# Patient Record
Sex: Female | Born: 1960 | Hispanic: No | Marital: Married | Smoking: Never smoker
Health system: Southern US, Community
[De-identification: ages and names within clinical notes are randomized; demographics above are authoritative.]

## PROBLEM LIST (undated history)

## (undated) DIAGNOSIS — G43909 Migraine, unspecified, not intractable, without status migrainosus: Secondary | ICD-10-CM

## (undated) DIAGNOSIS — K589 Irritable bowel syndrome without diarrhea: Secondary | ICD-10-CM

## (undated) DIAGNOSIS — G4700 Insomnia, unspecified: Secondary | ICD-10-CM

## (undated) DIAGNOSIS — D649 Anemia, unspecified: Secondary | ICD-10-CM

## (undated) DIAGNOSIS — E785 Hyperlipidemia, unspecified: Secondary | ICD-10-CM

## (undated) HISTORY — PX: TONSILLECTOMY: SUR1361

## (undated) HISTORY — DX: Migraine, unspecified, not intractable, without status migrainosus: G43.909

## (undated) HISTORY — DX: Anemia, unspecified: D64.9

## (undated) HISTORY — DX: Insomnia, unspecified: G47.00

## (undated) HISTORY — DX: Hyperlipidemia, unspecified: E78.5

## (undated) HISTORY — PX: OTHER SURGICAL HISTORY: SHX169

## (undated) HISTORY — DX: Irritable bowel syndrome, unspecified: K58.9

---

## 2004-08-21 ENCOUNTER — Other Ambulatory Visit: Admission: RE | Admit: 2004-08-21 | Discharge: 2004-08-21 | Payer: Self-pay | Admitting: Obstetrics and Gynecology

## 2013-05-30 ENCOUNTER — Other Ambulatory Visit: Payer: Self-pay | Admitting: Obstetrics and Gynecology

## 2013-05-30 DIAGNOSIS — R928 Other abnormal and inconclusive findings on diagnostic imaging of breast: Secondary | ICD-10-CM

## 2013-06-11 ENCOUNTER — Ambulatory Visit
Admission: RE | Admit: 2013-06-11 | Discharge: 2013-06-11 | Disposition: A | Discharge status: Home / Self Care | Payer: No Typology Code available for payment source | Source: Ambulatory Visit | Attending: Obstetrics and Gynecology | Admitting: Obstetrics and Gynecology

## 2013-06-11 DIAGNOSIS — R928 Other abnormal and inconclusive findings on diagnostic imaging of breast: Secondary | ICD-10-CM

## 2013-10-19 DIAGNOSIS — G43119 Migraine with aura, intractable, without status migrainosus: Secondary | ICD-10-CM | POA: Insufficient documentation

## 2013-10-19 DIAGNOSIS — G43719 Chronic migraine without aura, intractable, without status migrainosus: Secondary | ICD-10-CM | POA: Insufficient documentation

## 2014-05-06 ENCOUNTER — Other Ambulatory Visit: Payer: Self-pay

## 2014-05-06 DIAGNOSIS — Z1231 Encounter for screening mammogram for malignant neoplasm of breast: Secondary | ICD-10-CM

## 2014-05-27 ENCOUNTER — Ambulatory Visit: Payer: No Typology Code available for payment source

## 2014-05-30 ENCOUNTER — Ambulatory Visit
Admission: RE | Admit: 2014-05-30 | Discharge: 2014-05-30 | Disposition: A | Discharge status: Home / Self Care | Payer: No Typology Code available for payment source | Source: Ambulatory Visit

## 2014-05-30 ENCOUNTER — Encounter (INDEPENDENT_AMBULATORY_CARE_PROVIDER_SITE_OTHER): Payer: Self-pay

## 2014-05-30 DIAGNOSIS — Z1231 Encounter for screening mammogram for malignant neoplasm of breast: Secondary | ICD-10-CM

## 2014-05-31 ENCOUNTER — Other Ambulatory Visit: Payer: Self-pay | Admitting: Obstetrics and Gynecology

## 2014-05-31 DIAGNOSIS — R928 Other abnormal and inconclusive findings on diagnostic imaging of breast: Secondary | ICD-10-CM

## 2014-06-07 ENCOUNTER — Ambulatory Visit
Admission: RE | Admit: 2014-06-07 | Discharge: 2014-06-07 | Disposition: A | Discharge status: Home / Self Care | Payer: No Typology Code available for payment source | Source: Ambulatory Visit | Attending: Obstetrics and Gynecology | Admitting: Obstetrics and Gynecology

## 2014-06-07 DIAGNOSIS — R928 Other abnormal and inconclusive findings on diagnostic imaging of breast: Secondary | ICD-10-CM

## 2014-06-20 DIAGNOSIS — R519 Headache, unspecified: Secondary | ICD-10-CM | POA: Insufficient documentation

## 2014-06-20 DIAGNOSIS — R5383 Other fatigue: Secondary | ICD-10-CM | POA: Insufficient documentation

## 2015-05-15 ENCOUNTER — Other Ambulatory Visit: Payer: Self-pay

## 2015-05-15 DIAGNOSIS — Z1231 Encounter for screening mammogram for malignant neoplasm of breast: Secondary | ICD-10-CM

## 2015-06-13 ENCOUNTER — Ambulatory Visit: Payer: No Typology Code available for payment source

## 2015-06-27 ENCOUNTER — Ambulatory Visit
Admission: RE | Admit: 2015-06-27 | Discharge: 2015-06-27 | Disposition: A | Discharge status: Home / Self Care | Payer: BLUE CROSS/BLUE SHIELD | Source: Ambulatory Visit

## 2015-06-27 DIAGNOSIS — Z1231 Encounter for screening mammogram for malignant neoplasm of breast: Secondary | ICD-10-CM

## 2016-05-24 ENCOUNTER — Other Ambulatory Visit: Payer: Self-pay | Admitting: Obstetrics and Gynecology

## 2016-05-24 DIAGNOSIS — Z1231 Encounter for screening mammogram for malignant neoplasm of breast: Secondary | ICD-10-CM

## 2016-06-29 ENCOUNTER — Encounter: Payer: Self-pay | Admitting: Radiology

## 2016-06-29 ENCOUNTER — Ambulatory Visit
Admission: RE | Admit: 2016-06-29 | Discharge: 2016-06-29 | Disposition: A | Discharge status: Home / Self Care | Payer: BLUE CROSS/BLUE SHIELD | Source: Ambulatory Visit | Attending: Obstetrics and Gynecology | Admitting: Obstetrics and Gynecology

## 2016-06-29 DIAGNOSIS — Z1231 Encounter for screening mammogram for malignant neoplasm of breast: Secondary | ICD-10-CM

## 2017-06-02 ENCOUNTER — Other Ambulatory Visit: Payer: Self-pay | Admitting: Obstetrics and Gynecology

## 2017-06-02 DIAGNOSIS — Z1231 Encounter for screening mammogram for malignant neoplasm of breast: Secondary | ICD-10-CM

## 2017-07-14 ENCOUNTER — Ambulatory Visit
Admission: RE | Admit: 2017-07-14 | Discharge: 2017-07-14 | Disposition: A | Discharge status: Home / Self Care | Payer: BLUE CROSS/BLUE SHIELD | Source: Ambulatory Visit | Attending: Obstetrics and Gynecology | Admitting: Obstetrics and Gynecology

## 2017-07-14 DIAGNOSIS — Z1231 Encounter for screening mammogram for malignant neoplasm of breast: Secondary | ICD-10-CM

## 2017-07-18 ENCOUNTER — Other Ambulatory Visit: Payer: Self-pay | Admitting: Obstetrics and Gynecology

## 2017-07-18 DIAGNOSIS — R928 Other abnormal and inconclusive findings on diagnostic imaging of breast: Secondary | ICD-10-CM

## 2017-07-21 ENCOUNTER — Ambulatory Visit
Admission: RE | Admit: 2017-07-21 | Discharge: 2017-07-21 | Disposition: A | Discharge status: Home / Self Care | Payer: BLUE CROSS/BLUE SHIELD | Source: Ambulatory Visit | Attending: Obstetrics and Gynecology | Admitting: Obstetrics and Gynecology

## 2017-07-21 DIAGNOSIS — R928 Other abnormal and inconclusive findings on diagnostic imaging of breast: Secondary | ICD-10-CM

## 2018-08-30 ENCOUNTER — Other Ambulatory Visit: Payer: Self-pay | Admitting: Obstetrics and Gynecology

## 2018-08-30 DIAGNOSIS — Z1231 Encounter for screening mammogram for malignant neoplasm of breast: Secondary | ICD-10-CM

## 2018-09-21 DIAGNOSIS — E785 Hyperlipidemia, unspecified: Secondary | ICD-10-CM | POA: Insufficient documentation

## 2018-09-21 DIAGNOSIS — K589 Irritable bowel syndrome without diarrhea: Secondary | ICD-10-CM | POA: Insufficient documentation

## 2018-09-21 DIAGNOSIS — G43909 Migraine, unspecified, not intractable, without status migrainosus: Secondary | ICD-10-CM | POA: Insufficient documentation

## 2018-10-19 ENCOUNTER — Ambulatory Visit
Admission: RE | Admit: 2018-10-19 | Discharge: 2018-10-19 | Disposition: A | Discharge status: Home / Self Care | Payer: BC Managed Care – PPO | Source: Ambulatory Visit | Attending: Obstetrics and Gynecology | Admitting: Obstetrics and Gynecology

## 2018-10-19 ENCOUNTER — Other Ambulatory Visit: Payer: Self-pay

## 2018-10-19 DIAGNOSIS — Z1231 Encounter for screening mammogram for malignant neoplasm of breast: Secondary | ICD-10-CM

## 2019-04-30 ENCOUNTER — Other Ambulatory Visit: Payer: Self-pay

## 2019-04-30 ENCOUNTER — Telehealth (HOSPITAL_COMMUNITY): Payer: Self-pay

## 2019-04-30 DIAGNOSIS — M79604 Pain in right leg: Secondary | ICD-10-CM

## 2019-04-30 NOTE — Telephone Encounter (Signed)

## 2019-05-01 ENCOUNTER — Encounter: Payer: Self-pay | Admitting: Vascular Surgery

## 2019-05-01 ENCOUNTER — Other Ambulatory Visit: Payer: Self-pay

## 2019-05-01 ENCOUNTER — Ambulatory Visit (INDEPENDENT_AMBULATORY_CARE_PROVIDER_SITE_OTHER): Payer: BC Managed Care – PPO | Admitting: Vascular Surgery

## 2019-05-01 ENCOUNTER — Ambulatory Visit (HOSPITAL_COMMUNITY)
Admission: RE | Admit: 2019-05-01 | Discharge: 2019-05-01 | Disposition: A | Discharge status: Home / Self Care | Payer: BC Managed Care – PPO | Source: Ambulatory Visit | Attending: Surgery | Admitting: Surgery

## 2019-05-01 VITALS — BP 103/62 | HR 58 | Temp 97.8°F | Resp 20 | Ht 63.0 in | Wt 140.0 lb

## 2019-05-01 DIAGNOSIS — M79604 Pain in right leg: Secondary | ICD-10-CM | POA: Diagnosis not present

## 2019-05-01 DIAGNOSIS — I781 Nevus, non-neoplastic: Secondary | ICD-10-CM

## 2019-05-01 DIAGNOSIS — M79605 Pain in left leg: Secondary | ICD-10-CM | POA: Diagnosis not present

## 2019-05-01 NOTE — Progress Notes (Signed)
Vascular and Vein Specialist of [facility]  Patient name: Isabel Washington MRN: [id number] DOB: 1960-04-22 Sex: female  REASON FOR CONSULT: Evaluation lower extremity telangiectasias  HPI: Isabel Washington is a 59 y.o. female, who is here today for evaluation.  She had been seen by an outlying vein center and had been recommended to undergo staged bilateral great saphenous vein ablation.  Her main concern was regarding asymptomatic telangiectasia over her thighs and most recently more noticeable at the level of her left ankle.  She does have some diffuse achiness but no evidence of swelling and no other difficulty.  She had had an initial noninvasive study at the outlying center suggesting no significant reflux.  She subsequently wore compression for 3 months and had a repeat study that did show reflux and reportedly enlarged great saphenous vein bilaterally.  She was out of network and is seeing [place] today for additional discussion.  She has no history of DVT and no history of swelling  Past Medical History:  Diagnosis Date  . Anemia   . Hyperlipidemia   . IBS (irritable bowel syndrome)   . Insomnia   . Migraines     History reviewed. No pertinent family history.  SOCIAL HISTORY: Social History   Socioeconomic History  . Marital status: Married    Spouse name: Not on file  . Number of children: Not on file  . Years of education: Not on file  . Highest education level: Not on file  Occupational History  . Not on file  Tobacco Use  . Smoking status: Never Smoker  . Smokeless tobacco: Never Used  Substance and Sexual Activity  . Alcohol use: Not Currently  . Drug use: Never  . Sexual activity: Not on file  Other Topics Concern  . Not on file  Social History Narrative  . Not on file   Social Determinants of Health   Financial Resource Strain:   . Difficulty of Paying Living Expenses: Not on file  Food Insecurity:   . Worried About Community education officer in the Last Year: Not on file  . Ran Out of Food in the Last Year: Not on file  Transportation Needs:   . Lack of Transportation (Medical): Not on file  . Lack of Transportation (Non-Medical): Not on file  Physical Activity:   . Days of Exercise per Week: Not on file  . Minutes of Exercise per Session: Not on file  Stress:   . Feeling of Stress : Not on file  Social Connections:   . Frequency of Communication with Friends and Family: Not on file  . Frequency of Social Gatherings with Friends and Family: Not on file  . Attends Religious Services: Not on file  . Active Member of Clubs or Organizations: Not on file  . Attends Banker Meetings: Not on file  . Marital Status: Not on file  Intimate Partner Violence:   . Fear of Current or Ex-Partner: Not on file  . Emotionally Abused: Not on file  . Physically Abused: Not on file  . Sexually Abused: Not on file    Allergies  Allergen Reactions  . Gabapentin Swelling    Other reaction(s): EDEMA   . Pregabalin Swelling    Feet and ankle swelling Rash on face   . Latex     Skin irritation     Current Outpatient Medications  Medication Sig Dispense Refill  . cyproheptadine (PERIACTIN) 4 MG tablet Take 4 mg by mouth daily as  needed.    Marland Kitchen HYDROcodone-acetaminophen (NORCO/VICODIN) 5-325 MG tablet hydrocodone 5 mg-acetaminophen 325 mg tablet  TAKE 1 TABLET BY MOUTH TWICE A DAY AS NEEDED    . lamoTRIgine (LAMICTAL) 100 MG tablet lamotrigine 100 mg tablet    . loperamide (IMODIUM A-D) 2 MG tablet Take by mouth.    Marland Kitchen LORazepam (ATIVAN) 1 MG tablet lorazepam 1 mg tablet  TAKE 2 AND 1/2 TABLET(S) EACH EVENING    . methocarbamol (ROBAXIN) 750 MG tablet methocarbamol 750 mg tablet    . Multiple Vitamin (MULTIVITAMIN) capsule Take 1 capsule by mouth daily.    . pravastatin (PRAVACHOL) 20 MG tablet pravastatin 20 mg tablet    . propranolol ER (INDERAL LA) 120 MG 24 hr capsule Take 120 mg by mouth daily.    .  rifaximin (XIFAXAN) 200 MG tablet Xifaxan 200 mg tablet    . rizatriptan (MAXALT) 10 MG tablet TAKE 1 TABLET BY MOUTH AT ONSET OF HEADACHE. MAY REPEAT IN 2 HOURS. NO MORE THAN 2 TABLETS IN 24 HRS    . terconazole (TERAZOL 3) 0.8 % vaginal cream terconazole 0.8 % vaginal cream  INSERT 1 APPLICATOR(S)FUL EVERY DAY BY VAGINAL ROUTE AS NEEDED.    Marland Kitchen traMADol (ULTRAM) 50 MG tablet Take 50 mg by mouth 3 (three) times daily as needed.    . verapamil (CALAN-SR) 120 MG CR tablet verapamil ER (SR) 120 mg tablet,extended release  TAKE 1 TABLET BY MOUTH DAILY. DISCONTINUE NORVASC     No current facility-administered medications for this visit.    REVIEW OF SYSTEMS:  [X]  denotes positive finding, [ ]  denotes negative finding Cardiac  Comments:  Chest pain or chest pressure:    Shortness of breath upon exertion:    Short of breath when lying flat:    Irregular heart rhythm:        Vascular    Pain in calf, thigh, or hip brought on by ambulation:    Pain in feet at night that wakes you up from your sleep:     Blood clot in your veins:    Leg swelling:         Pulmonary    Oxygen at home:    Productive cough:     Wheezing:         Neurologic    Sudden weakness in arms or legs:     Sudden numbness in arms or legs:     Sudden onset of difficulty speaking or slurred speech:    Temporary loss of vision in one eye:     Problems with dizziness:         Gastrointestinal    Blood in stool:     Vomited blood:         Genitourinary    Burning when urinating:     Blood in urine:        Psychiatric    Major depression:         Hematologic    Bleeding problems:    Problems with blood clotting too easily:        Skin    Rashes or ulcers:        Constitutional    Fever or chills:      PHYSICAL EXAM: Vitals:   05/01/19 1553  BP: 103/62  Pulse: (!) 58  Resp: 20  Temp: 97.8 F (36.6 C)  SpO2: 98%  Weight: 140 lb (63.5 kg)  Height: 5\' 3"  (1.6 m)    GENERAL: The patient is a  well-nourished female, in no acute distress. The vital signs are documented above. CARDIOVASCULAR: 2+ radial and 2+ dorsalis pedis pulses bilaterally.  She does have scattered telangiectasia over her lateral thighs.  Does have more so on her ankles most particularly her left ankle.  No swelling. PULMONARY: There is good air exchange  ABDOMEN: Soft and non-tender  MUSCULOSKELETAL: There are no major deformities or cyanosis. NEUROLOGIC: No focal weakness or paresthesias are detected. SKIN: There are no ulcers or rashes noted. PSYCHIATRIC: The patient has a normal affect.  DATA:  I reviewed her duplex report from the outlying center.  I imaged her saphenous vein with Sono[employer] in our office.  Her saphenous veins actually are quite small today bilaterally.  MEDICAL ISSUES: I had a long discussion with the patient.  I do not see any indication for ablation of her great saphenous vein.  These are actually smaller than normal with the branching throughout the thigh.  I feel that it would be technically difficult to even access her veins for ablation.  She is concerned regarding the appearance of her telangiectasia most particularly at the level of her ankle left ankle.  I did explain the option of sclerotherapy for relief of this.  Explained this would be an out-of-pocket expense.  She wishes to discuss this further with her nursing staff who would be doing the sclerotherapy.  I will coordinate this discussion with the patient.  She will see me again on an as-needed basis   Rosetta Posner, MD [facility] Tel [phone] Pager [phone]

## 2019-05-11 ENCOUNTER — Telehealth (HOSPITAL_COMMUNITY): Payer: Self-pay

## 2019-05-11 NOTE — Telephone Encounter (Signed)

## 2019-05-14 ENCOUNTER — Ambulatory Visit (INDEPENDENT_AMBULATORY_CARE_PROVIDER_SITE_OTHER): Payer: Self-pay

## 2019-05-14 ENCOUNTER — Other Ambulatory Visit: Payer: Self-pay

## 2019-05-14 DIAGNOSIS — I8393 Asymptomatic varicose veins of bilateral lower extremities: Secondary | ICD-10-CM

## 2019-05-14 NOTE — Progress Notes (Signed)
Treated front of both leg areas of concern with 1% Asclero administered with a 27g butterfly.  Patient received a total of 2 mL. Easy access. Pt tolerated well. Anticipate good results. Will follow PRN.  Photos: Yes.    Compression stockings applied: Yes.

## 2019-05-15 DIAGNOSIS — I8393 Asymptomatic varicose veins of bilateral lower extremities: Secondary | ICD-10-CM

## 2019-05-21 ENCOUNTER — Telehealth: Payer: Self-pay

## 2019-05-21 NOTE — Telephone Encounter (Signed)
Pt called one week s/p sclerotherapy treatment. She has to go on a 7 hour car/road trip. Encouraged her to get out of car every couple of hours and walk around a bit, pump her feet periodically while in the car and wear her compression stockings. No further questions/concerns at this time.

## 2019-06-13 ENCOUNTER — Telehealth: Payer: Self-pay

## 2019-06-13 NOTE — Telephone Encounter (Signed)
Called pt to f/u at one month from sclerotherapy tx. Pt feels she notices some fading but not as much as she would like. She feels she is seeing more fading recently than she had at first. We discussed healing and fading takes time and to allow a few more weeks before considering another treatment. Pt had no questions/concerns.

## 2019-10-03 ENCOUNTER — Other Ambulatory Visit: Payer: Self-pay | Admitting: Obstetrics and Gynecology

## 2019-10-03 DIAGNOSIS — Z1231 Encounter for screening mammogram for malignant neoplasm of breast: Secondary | ICD-10-CM

## 2019-10-25 ENCOUNTER — Ambulatory Visit: Payer: BC Managed Care – PPO

## 2019-11-19 ENCOUNTER — Ambulatory Visit
Admission: RE | Admit: 2019-11-19 | Discharge: 2019-11-19 | Disposition: A | Discharge status: Home / Self Care | Payer: BC Managed Care – PPO | Source: Ambulatory Visit | Attending: Obstetrics and Gynecology | Admitting: Obstetrics and Gynecology

## 2019-11-19 ENCOUNTER — Other Ambulatory Visit: Payer: Self-pay

## 2019-11-19 DIAGNOSIS — Z1231 Encounter for screening mammogram for malignant neoplasm of breast: Secondary | ICD-10-CM

## 2020-07-24 DIAGNOSIS — N3281 Overactive bladder: Secondary | ICD-10-CM | POA: Insufficient documentation

## 2020-07-24 DIAGNOSIS — R300 Dysuria: Secondary | ICD-10-CM | POA: Insufficient documentation

## 2020-09-11 ENCOUNTER — Encounter: Payer: Self-pay | Admitting: Podiatry

## 2020-09-11 ENCOUNTER — Other Ambulatory Visit: Payer: Self-pay | Admitting: Podiatry

## 2020-09-11 ENCOUNTER — Other Ambulatory Visit: Payer: Self-pay

## 2020-09-11 ENCOUNTER — Ambulatory Visit (INDEPENDENT_AMBULATORY_CARE_PROVIDER_SITE_OTHER): Payer: BC Managed Care – PPO

## 2020-09-11 ENCOUNTER — Ambulatory Visit (INDEPENDENT_AMBULATORY_CARE_PROVIDER_SITE_OTHER): Payer: BC Managed Care – PPO | Admitting: Podiatry

## 2020-09-11 DIAGNOSIS — S9032XA Contusion of left foot, initial encounter: Secondary | ICD-10-CM

## 2020-09-11 DIAGNOSIS — R319 Hematuria, unspecified: Secondary | ICD-10-CM | POA: Insufficient documentation

## 2020-09-11 DIAGNOSIS — M19072 Primary osteoarthritis, left ankle and foot: Secondary | ICD-10-CM | POA: Diagnosis not present

## 2020-09-11 DIAGNOSIS — M8700 Idiopathic aseptic necrosis of unspecified bone: Secondary | ICD-10-CM

## 2020-09-11 DIAGNOSIS — M8580 Other specified disorders of bone density and structure, unspecified site: Secondary | ICD-10-CM | POA: Insufficient documentation

## 2020-09-11 DIAGNOSIS — M2041 Other hammer toe(s) (acquired), right foot: Secondary | ICD-10-CM

## 2020-09-13 NOTE — Progress Notes (Signed)
Subjective:  Patient ID: Isabel Washington, female    DOB: 1961/01/05,  MRN: [id number] HPI Chief Complaint  Patient presents with  . Foot Pain    Dorsal forefoot left - fell down steps Oct. 2020, toes bent downward and dragged the steps-had xrayed - fx - wore boot, got better, now feeling like a band is wrapped around the big toe, discomfort if walking a lot  . New Patient (Initial Visit)    60 y.o. female presents with the above complaint.   ROS: Denies fever chills nausea vomiting muscle aches pains calf pain back pain chest pain shortness of breath.  Past Medical History:  Diagnosis Date  . Anemia   . Hyperlipidemia   . IBS (irritable bowel syndrome)   . Insomnia   . Migraines    Past Surgical History:  Procedure Laterality Date  . CESAREAN SECTION    . kidney stone removal    . TONSILLECTOMY      Current Outpatient Medications:  .  HYDROcodone-acetaminophen (NORCO/VICODIN) 5-325 MG tablet, Take 1 tablet by mouth every 6 (six) hours as needed for moderate pain., Disp: , Rfl:  .  lamoTRIgine (LAMICTAL) 100 MG tablet, lamotrigine 100 mg tablet, Disp: , Rfl:  .  LORazepam (ATIVAN) 1 MG tablet, lorazepam 1 mg tablet  TAKE 2 AND 1/2 TABLET(S) EACH EVENING, Disp: , Rfl:  .  methocarbamol (ROBAXIN) 750 MG tablet, methocarbamol 750 mg tablet, Disp: , Rfl:  .  Multiple Vitamin (MULTIVITAMIN) capsule, Take 1 capsule by mouth daily., Disp: , Rfl:  .  NURTEC 75 MG TBDP, Take by mouth., Disp: , Rfl:  .  pravastatin (PRAVACHOL) 20 MG tablet, pravastatin 20 mg tablet, Disp: , Rfl:  .  promethazine (PHENERGAN) 25 MG tablet, Take 25 mg by mouth daily as needed., Disp: , Rfl:  .  rifaximin (XIFAXAN) 200 MG tablet, Xifaxan 200 mg tablet, Disp: , Rfl:  .  rizatriptan (MAXALT) 10 MG tablet, TAKE 1 TABLET BY MOUTH AT ONSET OF HEADACHE. MAY REPEAT IN 2 HOURS. NO MORE THAN 2 TABLETS IN 24 HRS, Disp: , Rfl:  .  terconazole (TERAZOL 3) 0.8 % vaginal cream, terconazole 0.8 % vaginal cream  INSERT 1  APPLICATOR(S)FUL EVERY DAY BY VAGINAL ROUTE AS NEEDED., Disp: , Rfl:   Allergies  Allergen Reactions  . Gabapentin Swelling    Other reaction(s): EDEMA   . Pregabalin Swelling    Feet and ankle swelling Rash on face   . Latex     Skin irritation    Review of Systems Objective:  There were no vitals filed for this visit.  General: Well developed, nourished, in no acute distress, alert and oriented x3   Dermatological: Skin is warm, dry and supple bilateral. Nails x 10 are well maintained; remaining integument appears unremarkable at this time. There are no open sores, no preulcerative lesions, no rash or signs of infection present.  Vascular: Dorsalis Pedis artery and Posterior Tibial artery pedal pulses are 2/4 bilateral with immedate capillary fill time. Pedal hair growth present. No varicosities and no lower extremity edema present bilateral.   Neruologic: Grossly intact via light touch bilateral. Vibratory intact via tuning fork bilateral. Protective threshold with Semmes Wienstein monofilament intact to all pedal sites bilateral. Patellar and Achilles deep tendon reflexes 2+ bilateral. No Babinski or clonus noted bilateral.   Musculoskeletal: No gross boney pedal deformities bilateral. No pain, crepitus, or limitation noted with foot and ankle range of motion bilateral. Muscular strength 5/5 in all groups tested bilateral.  Moderate to severe pain on palpation of the first metatarsophalangeal joint and attempted range of motion.  She also has significant pain on palpation of the sesamoidal apparatus.  There is mild swelling no erythema cellulitis drainage or odor no open lesions or wounds.  She does have tenderness on palpation and range of motion of the hallux interphalangeal joint as well.  Gait: Unassisted, Nonantalgic.    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  Also demonstrates joint space narrowing of the first metatarsophalangeal joint and slight increase in the first intermetatarsal angle with hallux abductus.  There is some subchondral sclerosis at the level of the interphalangeal joint consistent with early osteoarthritic changes lateral view of the first metatarsal phalangeal joint demonstrates a dorsal spur.  Radiographically I see no signs of fracture.  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  Plan: At this point conservative therapies have failed as she has tried to wear her cam boot.  Anti-inflammatories have failed.  A change in shoe gear has also failed to alleviate her symptoms.  I will follow-up with her after MRI is done.  MRI is being requested for further diagnostic imaging as well as surgical consideration and differential diagnosis     Tavon Magnussen T. [place], [place]

## 2020-10-06 ENCOUNTER — Other Ambulatory Visit: Payer: Self-pay

## 2020-10-06 ENCOUNTER — Ambulatory Visit
Admission: RE | Admit: 2020-10-06 | Discharge: 2020-10-06 | Disposition: A | Discharge status: Home / Self Care | Payer: BC Managed Care – PPO | Source: Ambulatory Visit | Attending: Podiatry | Admitting: Podiatry

## 2020-10-06 DIAGNOSIS — S9032XA Contusion of left foot, initial encounter: Secondary | ICD-10-CM

## 2020-10-06 DIAGNOSIS — M19072 Primary osteoarthritis, left ankle and foot: Secondary | ICD-10-CM

## 2020-10-16 ENCOUNTER — Encounter: Payer: Self-pay | Admitting: Podiatry

## 2020-10-16 ENCOUNTER — Ambulatory Visit (INDEPENDENT_AMBULATORY_CARE_PROVIDER_SITE_OTHER): Payer: BC Managed Care – PPO | Admitting: Podiatry

## 2020-10-16 ENCOUNTER — Other Ambulatory Visit: Payer: Self-pay

## 2020-10-16 DIAGNOSIS — M19072 Primary osteoarthritis, left ankle and foot: Secondary | ICD-10-CM | POA: Diagnosis not present

## 2020-10-16 DIAGNOSIS — M778 Other enthesopathies, not elsewhere classified: Secondary | ICD-10-CM | POA: Diagnosis not present

## 2020-10-16 MED ORDER — TRIAMCINOLONE ACETONIDE 40 MG/ML IJ SUSP
20.0000 mg | Freq: Once | INTRAMUSCULAR | Status: AC
  Administered 2020-10-16: 20 mg

## 2020-10-17 NOTE — Progress Notes (Signed)
She presents today for follow-up of her painful foot left.  She states that this is remained painful since she fell down the stairs in October stating that the pain is worse around the great toe.  Objective: Vital signs are stable alert and oriented x3.  Pulses are palpable.  She still has pain on palpation of the first metatarsophalangeal joint MRI states that there is some significant osteoarthritic changes at this level.  Assessment: Osteoarthritis.  Plan: At this point I offered her an injection she initially declined we did discuss surgical intervention.  She once again asked me about an injection so we provided her with an injection of the first metatarsophalangeal joint of that left foot today 10 mg Kenalog 5 mg of Marcaine to the point of maximal tenderness.  This did go inside the joint.  She tolerated procedure well after sterile Betadine skin prep.  If this fails to alleviate her symptoms then we will consider surgical intervention.

## 2020-11-04 ENCOUNTER — Other Ambulatory Visit: Payer: Self-pay | Admitting: Obstetrics and Gynecology

## 2020-11-04 DIAGNOSIS — Z1231 Encounter for screening mammogram for malignant neoplasm of breast: Secondary | ICD-10-CM

## 2020-11-20 ENCOUNTER — Ambulatory Visit: Payer: BC Managed Care – PPO | Admitting: Podiatry

## 2020-12-29 ENCOUNTER — Ambulatory Visit: Payer: BC Managed Care – PPO

## 2021-01-07 ENCOUNTER — Other Ambulatory Visit: Payer: Self-pay

## 2021-01-07 ENCOUNTER — Ambulatory Visit
Admission: RE | Admit: 2021-01-07 | Discharge: 2021-01-07 | Disposition: A | Discharge status: Home / Self Care | Payer: BC Managed Care – PPO | Source: Ambulatory Visit | Attending: Obstetrics and Gynecology | Admitting: Obstetrics and Gynecology

## 2021-01-07 DIAGNOSIS — Z1231 Encounter for screening mammogram for malignant neoplasm of breast: Secondary | ICD-10-CM

## 2021-10-22 ENCOUNTER — Other Ambulatory Visit: Payer: Self-pay | Admitting: Obstetrics and Gynecology

## 2021-10-22 DIAGNOSIS — Z1231 Encounter for screening mammogram for malignant neoplasm of breast: Secondary | ICD-10-CM

## 2022-01-08 ENCOUNTER — Ambulatory Visit
Admission: RE | Admit: 2022-01-08 | Discharge: 2022-01-08 | Disposition: A | Discharge status: Home / Self Care | Payer: BC Managed Care – PPO | Source: Ambulatory Visit | Attending: Obstetrics and Gynecology | Admitting: Obstetrics and Gynecology

## 2022-01-08 DIAGNOSIS — Z1231 Encounter for screening mammogram for malignant neoplasm of breast: Secondary | ICD-10-CM

## 2022-01-26 ENCOUNTER — Other Ambulatory Visit (HOSPITAL_BASED_OUTPATIENT_CLINIC_OR_DEPARTMENT_OTHER): Payer: Self-pay | Admitting: Internal Medicine

## 2022-01-26 ENCOUNTER — Other Ambulatory Visit (HOSPITAL_COMMUNITY): Payer: Self-pay | Admitting: Internal Medicine

## 2022-01-26 DIAGNOSIS — E782 Mixed hyperlipidemia: Secondary | ICD-10-CM

## 2022-02-10 ENCOUNTER — Ambulatory Visit (HOSPITAL_BASED_OUTPATIENT_CLINIC_OR_DEPARTMENT_OTHER)
Admission: RE | Admit: 2022-02-10 | Discharge: 2022-02-10 | Disposition: A | Discharge status: Home / Self Care | Payer: BC Managed Care – PPO | Source: Ambulatory Visit | Attending: Internal Medicine | Admitting: Internal Medicine

## 2022-02-10 DIAGNOSIS — E782 Mixed hyperlipidemia: Secondary | ICD-10-CM | POA: Insufficient documentation

## 2022-07-01 IMAGING — MG MM DIGITAL SCREENING BILAT W/ TOMO AND CAD
8 series · 9 of 24 positions shown · non-contrast
Comparison: Previous exam(s).

CLINICAL DATA: Screening.

EXAM:
DIGITAL SCREENING BILATERAL MAMMOGRAM WITH TOMOSYNTHESIS AND CAD
TECHNIQUE: Bilateral screening digital craniocaudal and mediolateral oblique
mammograms were obtained. Bilateral screening digital breast
tomosynthesis was performed. The images were evaluated with
computer-aided detection.

[L CC synth-2D]
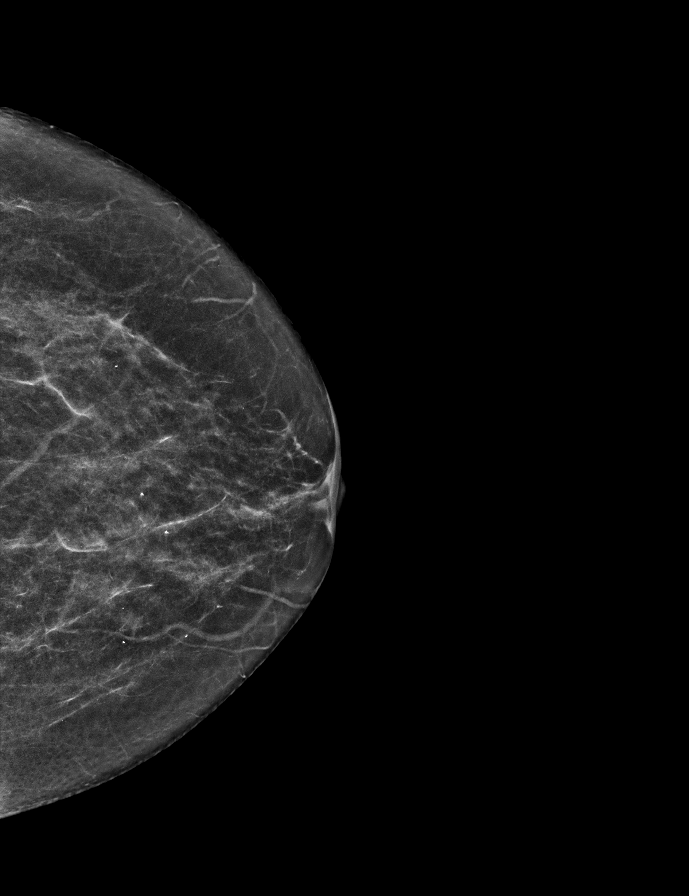

[L MLO synth-2D]
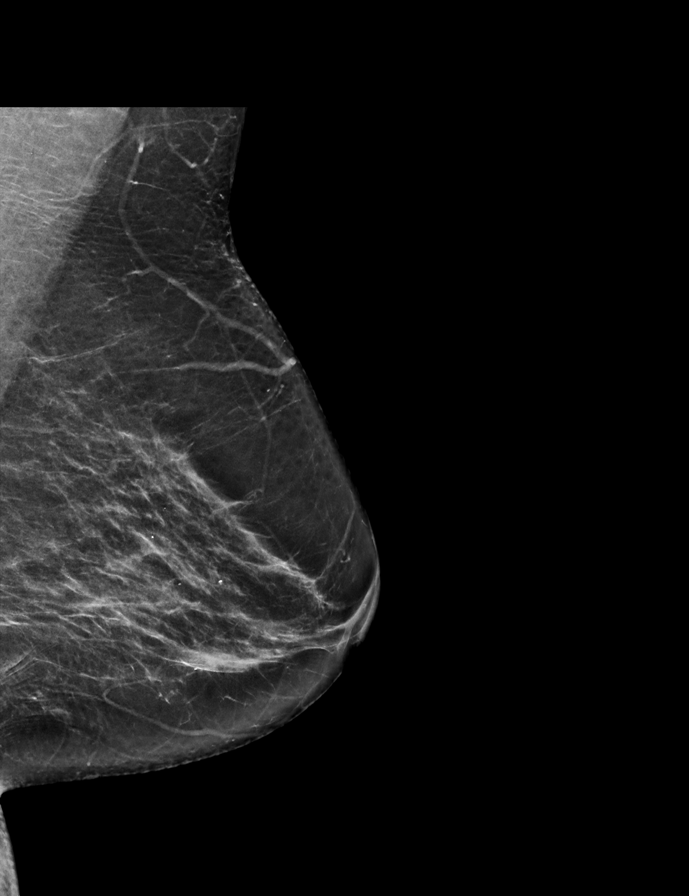

[R MLO synth-2D]
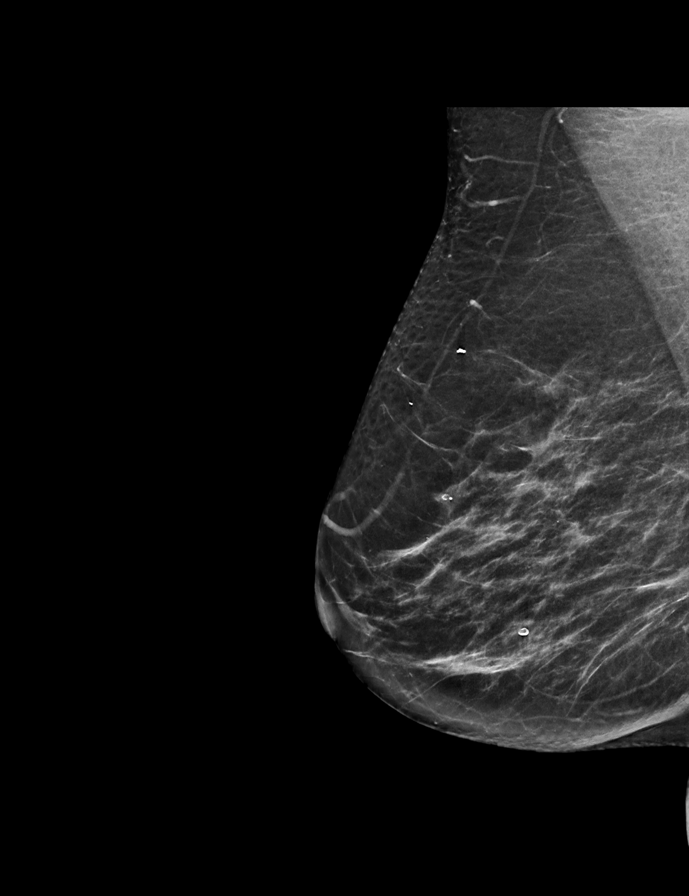

[R CC synth-2D]
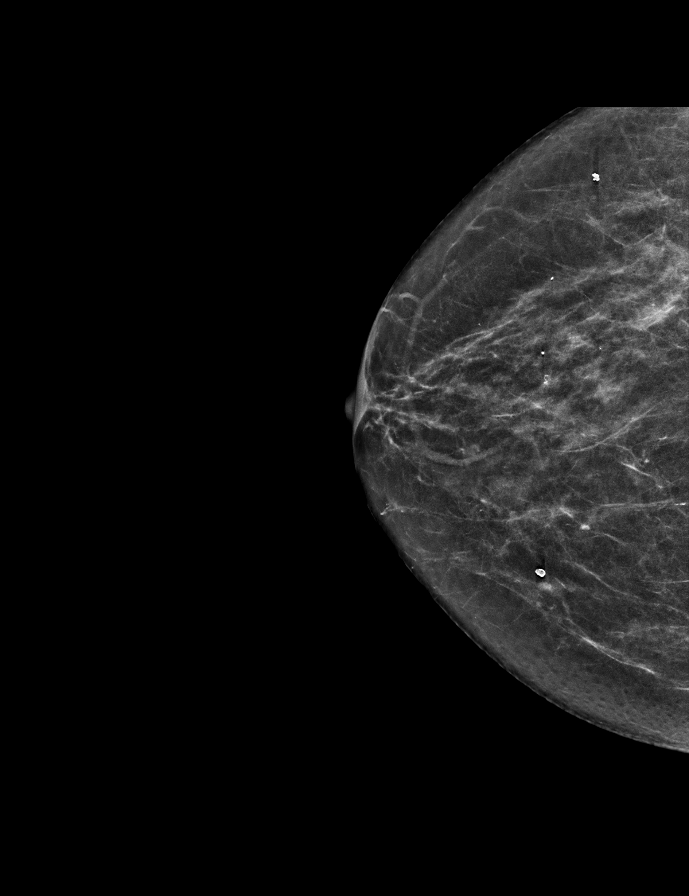

[L MLO tomo · 2 of 67 frames shown]
[frame 22/67]
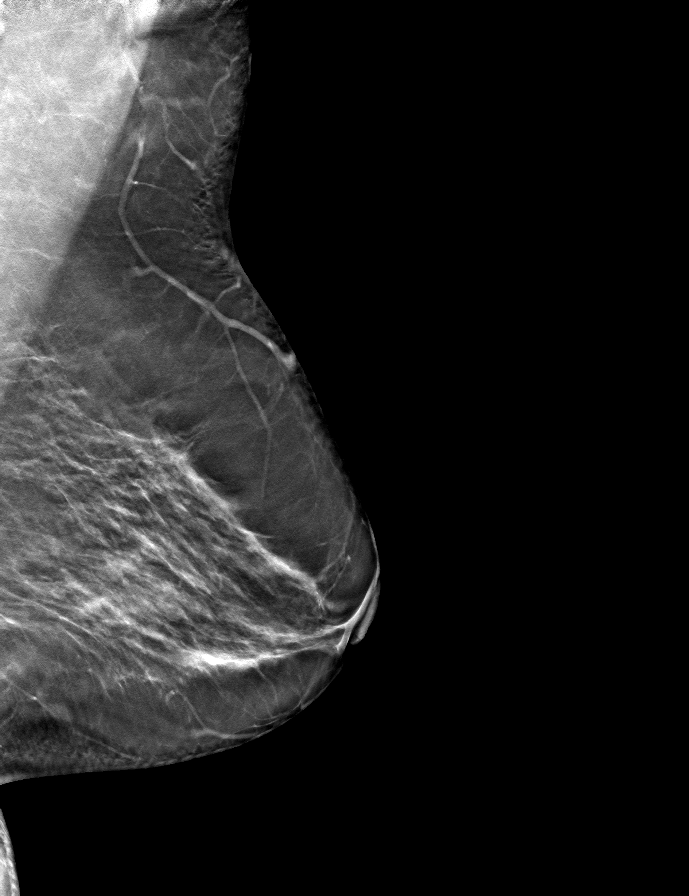
[frame 34/67]
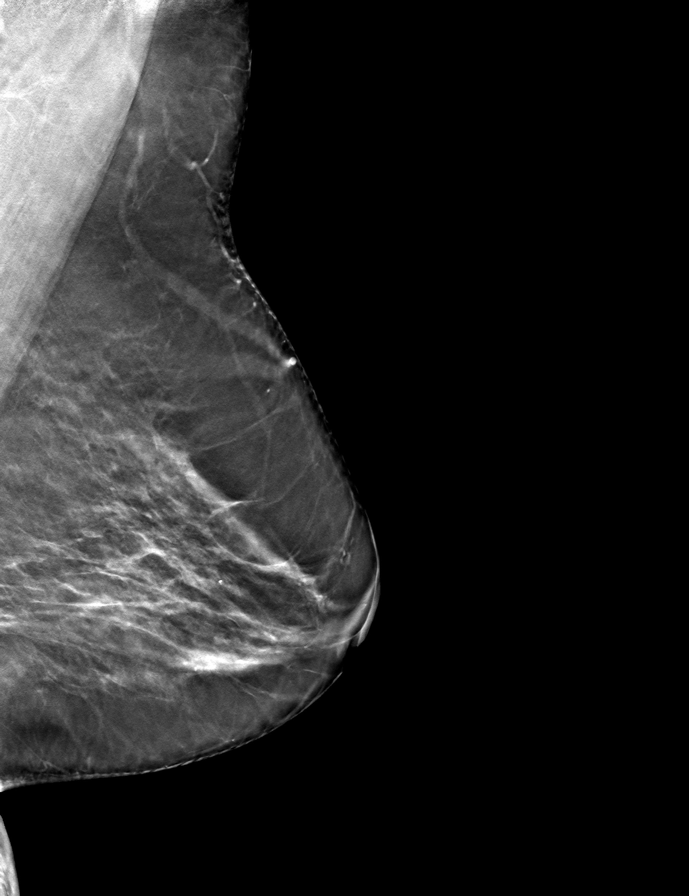

[L CC tomo · tomo slice 26/51.0]
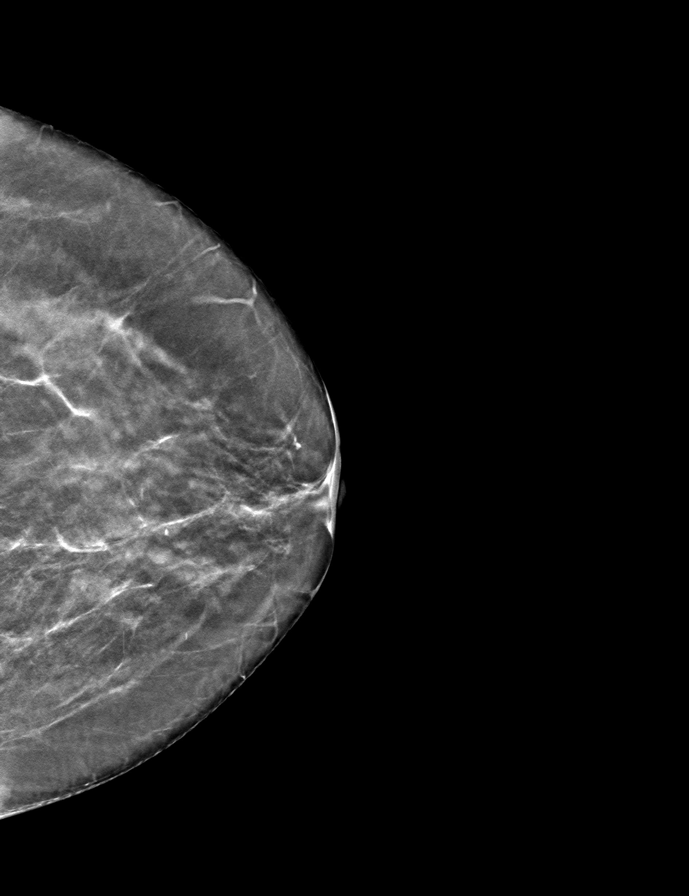

[R CC tomo · tomo slice 27/52.0]
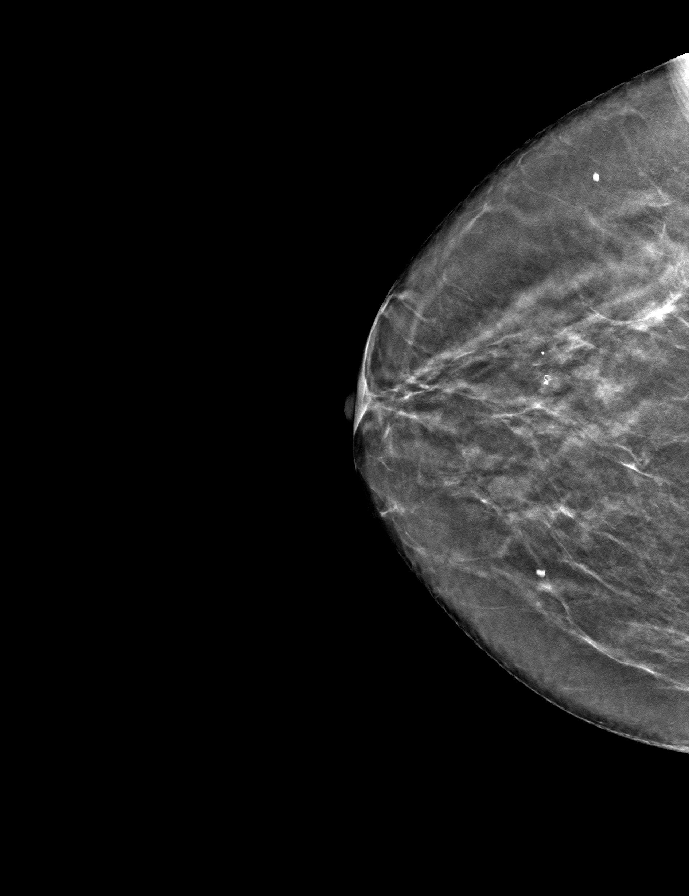

[R MLO tomo · tomo slice 33/65.0]
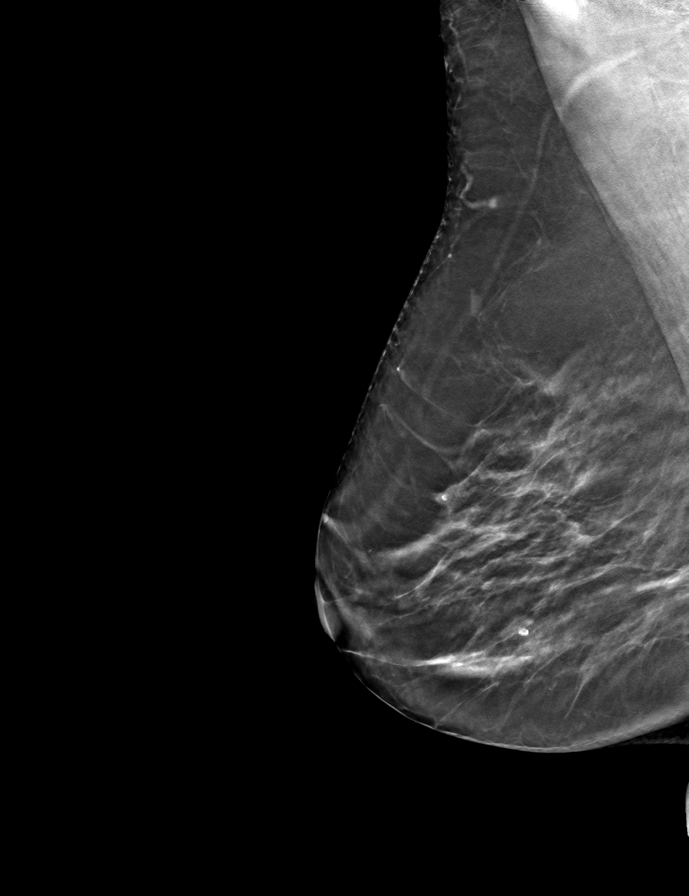

[9 of 24 positions shown; findings below may reference images not displayed]

ACR Breast Density Category b: There are scattered areas of
fibroglandular density.
FINDINGS: There are no findings suspicious for malignancy.
IMPRESSION: No mammographic evidence of malignancy. A result letter of this
screening mammogram will be mailed directly to the patient.

RECOMMENDATION:
Screening mammogram in one year. (Code:51-O-LD2)

BI-RADS CATEGORY  1: Negative.

## 2023-01-26 ENCOUNTER — Other Ambulatory Visit: Payer: Self-pay | Admitting: Obstetrics and Gynecology

## 2023-01-26 DIAGNOSIS — Z1231 Encounter for screening mammogram for malignant neoplasm of breast: Secondary | ICD-10-CM

## 2023-02-09 ENCOUNTER — Ambulatory Visit
Admission: RE | Admit: 2023-02-09 | Discharge: 2023-02-09 | Disposition: A | Discharge status: Home / Self Care | Payer: BC Managed Care – PPO | Source: Ambulatory Visit

## 2023-02-09 DIAGNOSIS — Z1231 Encounter for screening mammogram for malignant neoplasm of breast: Secondary | ICD-10-CM

## 2024-01-05 ENCOUNTER — Other Ambulatory Visit: Payer: Self-pay | Admitting: Obstetrics and Gynecology

## 2024-01-05 DIAGNOSIS — Z1231 Encounter for screening mammogram for malignant neoplasm of breast: Secondary | ICD-10-CM

## 2024-02-13 ENCOUNTER — Ambulatory Visit
Admission: RE | Admit: 2024-02-13 | Discharge: 2024-02-13 | Disposition: A | Discharge status: Home / Self Care | Source: Ambulatory Visit | Attending: Obstetrics and Gynecology | Admitting: Obstetrics and Gynecology

## 2024-02-13 DIAGNOSIS — Z1231 Encounter for screening mammogram for malignant neoplasm of breast: Secondary | ICD-10-CM
